# Patient Record
Sex: Male | Born: 1995 | Race: White | Hispanic: No | Marital: Single | Smoking: Never smoker
Health system: Southern US, Community
[De-identification: ages and names within clinical notes are randomized; demographics above are authoritative.]

## PROBLEM LIST (undated history)

## (undated) HISTORY — PX: KNEE SURGERY: SHX244

---
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
## 2019-01-28 ENCOUNTER — Telehealth: Payer: Self-pay | Admitting: Pediatrics

## 2019-01-28 NOTE — Telephone Encounter (Signed)
Patients mom advised and she accepted appointment. Appointment scheduled.

## 2019-01-28 NOTE — Telephone Encounter (Signed)
You can put him in my 1:40PM  slot on Monday, he should arrive by 1:25 PM. I have absolutely no other slots besides that one.

## 2019-01-28 NOTE — Telephone Encounter (Signed)
Mother Viliami Bracco [phone]  Calling for son/pt needing to see if he can possibly be worked in with [employer] as a new pt and discuss some to severe depression.  Santiago Glad thought Fabio Bering would be a good fit for pt and he said he would talk with her. However, Nov is too far out for the needs he is having and experiencing currently.  Please call Santiago Glad back asap to let her know if this can be done.  Thanks, [employer]

## 2019-02-01 ENCOUNTER — Other Ambulatory Visit: Payer: Self-pay

## 2019-02-01 ENCOUNTER — Encounter: Payer: Self-pay | Admitting: Physician Assistant

## 2019-02-01 ENCOUNTER — Telehealth: Payer: Self-pay | Admitting: Physician Assistant

## 2019-02-01 ENCOUNTER — Ambulatory Visit (INDEPENDENT_AMBULATORY_CARE_PROVIDER_SITE_OTHER): Payer: BC Managed Care – PPO | Admitting: Physician Assistant

## 2019-02-01 VITALS — BP 115/75 | HR 78 | Temp 97.5°F | Ht 70.0 in | Wt 189.0 lb

## 2019-02-01 DIAGNOSIS — F4323 Adjustment disorder with mixed anxiety and depressed mood: Secondary | ICD-10-CM | POA: Diagnosis not present

## 2019-02-01 DIAGNOSIS — Z23 Encounter for immunization: Secondary | ICD-10-CM | POA: Diagnosis not present

## 2019-02-01 NOTE — Patient Instructions (Signed)

## 2019-02-01 NOTE — Telephone Encounter (Signed)
Mom would like Bradley Morse to call her before her sons appt at 1:20 today.  She would like to go over something if she can before she sees him today.  Mom's # [phone]  Con Memos

## 2019-02-01 NOTE — Progress Notes (Signed)
Patient: Bradley Morse, Male    DOB: 06-07-1995, 23 y.o.   MRN: [id number] Visit Date: 02/01/2019  Today's Provider: Trey Sailors, PA-C   Chief Complaint  Patient presents with  . New Patient (Initial Visit)   Subjective:  I, Porsha McClurkin CMA, am acting as a Neurosurgeon for [employer], PA-C.    New Patient Appointment Bradley Morse is a 24 y.o. male who presents today for new patient appointment. He feels well. He reports exercising includes weight lifting and cardio. He reports he is sleeping poorly.  Lives in [place] with mom, dad, sisters, and two grandmothers. He is studying Doctor, hospital at [employer] and anticipates completing his degree in 2021-2022.   Smoking: none Drugs: marijuana Alcohol: 0 drinks/ week average but does drink some occasionally.   He is presenting today with the following issues. He reports trouble sleeping. Reports his parents are concerned but he is as well. Reports he is "in his head" frequently and worries excessively about things that he knows he shouldn't worry about. He reports this started in 2017. In 2017 he attended [employer] for university but ended up dropping out due to poor academic performance. He reports at that time he was down on himself and did not feel smart enough. He reports he has never been formally diagnosed with anxiety and depression. Cites difficult relationship with his twin sister whom he lives with, which is normal. He also cites difficult relationship with father.   He has lost weight over the past several months but he has been working out frequently and intending to lose weight.   Patient asks about other possible causes including thyroid issues.   He got his flu shot last week. Does not recall last tetanus vaccine.   Wt Readings from Last 3 Encounters:  02/01/19 189 lb (85.7 kg)       Office Visit from 02/01/2019 in [facility]  PHQ-9 Total Score  12       -----------------------------------------------------------------   Review of Systems  Constitutional: Negative.   HENT: Negative.   Eyes: Negative.   Respiratory: Negative.   Cardiovascular: Negative.   Gastrointestinal: Negative.   Endocrine: Negative.   Genitourinary: Negative.   Musculoskeletal: Negative.   Skin: Negative.   Allergic/Immunologic: Negative.   Neurological: Negative.   Hematological: Negative.   Psychiatric/Behavioral: Negative.     Social History He  reports that he has never smoked. He has never used smokeless tobacco. He reports current alcohol use. He reports that he does not use drugs. Social History   Socioeconomic History  . Marital status: Single    Spouse name: Not on file  . Number of children: Not on file  . Years of education: Not on file  . Highest education level: Not on file  Occupational History  . Not on file  Social Needs  . Financial resource strain: Not on file  . Food insecurity    Worry: Not on file    Inability: Not on file  . Transportation needs    Medical: Not on file    Non-medical: Not on file  Tobacco Use  . Smoking status: Never Smoker  . Smokeless tobacco: Never Used  Substance and Sexual Activity  . Alcohol use: Yes    Alcohol/week: 0.0 standard drinks  . Drug use: Never  . Sexual activity: Not on file  Lifestyle  . Physical activity    Days per week: Not on file  Minutes per session: Not on file  . Stress: Not on file  Relationships  . Social Musicianconnections    Talks on phone: Not on file    Gets together: Not on file    Attends religious service: Not on file    Active member of club or organization: Not on file    Attends meetings of clubs or organizations: Not on file    Relationship status: Not on file  Other Topics Concern  . Not on file  Social History Narrative  . Not on file    There are no active problems to display for this patient.   Past Surgical History:  Procedure Laterality Date  .  KNEE SURGERY      Family History  No family status information on file.   His family history is not on file.     Allergies  Allergen Reactions  . Oxycodone     Previous Medications   ACETAMINOPHEN (TYLENOL) 500 MG TABLET    Take 500 mg by mouth every 6 (six) hours as needed.   CEPHALEXIN (KEFLEX) 500 MG CAPSULE    Take 1 capsule (500 mg total) by mouth 2 (two) times daily.   HYDROCODONE-ACETAMINOPHEN (NORCO/VICODIN) 5-325 MG TABLET    Take 1 tablet by mouth every 6 (six) hours as needed for moderate pain.   IBUPROFEN (ADVIL,MOTRIN) 200 MG TABLET    Take 200 mg by mouth every 6 (six) hours as needed.    Patient Care Team: Maryella ShiversPollak, Adriana M, PA-C as PCP - General (Physician Assistant)      Objective:   Vitals: BP 115/75 (BP Location: Left Arm, Patient Position: Sitting, Cuff Size: Normal)   Pulse 78   Temp (!) 97.5 F (36.4 C) (Temporal)   Ht 5\' 10"  (1.778 m)   Wt 189 lb (85.7 kg)   BMI 27.12 kg/m    Physical Exam Constitutional:      Appearance: Normal appearance.  Cardiovascular:     Rate and Rhythm: Normal rate and regular rhythm.     Heart sounds: Normal heart sounds.  Pulmonary:     Effort: Pulmonary effort is normal.     Breath sounds: Normal breath sounds.  Skin:    General: Skin is warm and dry.  Neurological:     Mental Status: He is alert and oriented to person, place, and time. Mental status is at baseline.  Psychiatric:        Mood and Affect: Mood normal.        Behavior: Behavior normal.      Depression Screen PHQ 2/9 Scores 02/01/2019  PHQ - 2 Score 2  PHQ- 9 Score 12      Assessment & Plan:     Routine Health Maintenance and Physical Exam  Exercise Activities and Dietary recommendations Goals   None      There is no immunization history on file for this patient.  Health Maintenance  Topic Date Due  . HIV Screening  12/13/2011  . TETANUS/TDAP  12/13/2015  . INFLUENZA VACCINE  11/07/2018     Discussed health benefits of  physical activity, and encouraged him to engage in regular exercise appropriate for his age and condition.    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 Patient would like to pursue counseling and return if he feels the need for further treatment. Counseled patient I feel his issues are unlikely due to  his thyroid but I am happy to pursue labwork. He defers at this time but knows he can message or call the clinic if he would like to get labwork.  - Ambulatory referral to Chronic Care Management Services  2. Need for tetanus booster  Flu vaccine last week, will give this at next clinic visit.   The entirety of the information documented in the History of Present Illness, Review of Systems and Physical Exam were personally obtained by me. Portions of this information were initially documented by [employer] McClurkin, CMA and reviewed by me for thoroughness and accuracy.   F/u PRN    --------------------------------------------------------------------

## 2019-02-01 NOTE — Telephone Encounter (Signed)
Mom wrote a note to [place].

## 2019-02-08 ENCOUNTER — Telehealth: Payer: Self-pay | Admitting: Physician Assistant

## 2019-02-08 NOTE — Telephone Encounter (Signed)
Mom of pt called checking the name of the provider that will be the counselor for pt to see if he/she will be in insurance network.  No name was given.  Just an FYI to let Fabio Bering know pt has not heard from a counselor's office to schedule the appt.  Thanks, [employer]

## 2019-02-09 NOTE — Telephone Encounter (Signed)
Referral was put in 02/01/19, is there any information I can relay to patient about his appointment? KW

## 2019-02-15 ENCOUNTER — Ambulatory Visit: Payer: Self-pay | Admitting: *Deleted

## 2019-02-15 ENCOUNTER — Encounter: Payer: Self-pay | Admitting: *Deleted

## 2019-02-15 NOTE — Chronic Care Management (AMB) (Deleted)
    Care Management   Unsuccessful Call Note 02/15/2019 Name: Bradley Morse MRN: [id number] DOB: 15-Nov-1995  Patient  is a 23 year old male who sees Carles Collet, [place] for primary care. Carles Collet PA-C asked the CCM team to consult the patient for mental health counseling and resources.  Referral was placed 02/01/19. Patient's last office visit was 02/01/19.     This social worker was unable to reach patient via telephone today to consent for services. I have left HIPAA compliant voicemail asking patient to return my call. (unsuccessful outreach #1).   Plan: Will follow-up within 7 business days via telephone.      Elliot Gurney, [place] Worker  [facility] Care Management [phone]

## 2019-02-15 NOTE — Chronic Care Management (AMB) (Signed)
   Care Management   Unsuccessful Call Note 02/15/2019 Name: Bradley Morse MRN: [id number] DOB: April 30, 1995  Patient  is a 23 year old male who sees Carles Collet, [place]  for primary care. Carles Collet, PA-C asked the CCM team to consult the patient for Mental Health Counseling and Resources. Referral was placed 02/01/19. Patient's last office visit was 02/01/19.     This social worker was unable to reach patient via telephone today to introduce services and complete assessment. I have left HIPAA compliant voicemail asking patient to return my call. (unsuccessful outreach #1).   Plan: Will follow-up within 7 business days via telephone.      Elliot Gurney, [place] Worker  [facility] [phone]

## 2019-02-15 NOTE — Progress Notes (Signed)
This encounter was created in error - please disregard.

## 2019-02-22 ENCOUNTER — Ambulatory Visit: Payer: Self-pay | Admitting: *Deleted

## 2019-02-22 NOTE — Chronic Care Management (AMB) (Signed)
Care Management   Social Work Note  02/22/2019 Name: Bradley Morse MRN: [id number] DOB: 12-06-1995  Bradley Morse is a 23 y.o. year old male who sees Trinna Post, [place] for primary care. The CCM team was consulted for assistance with Mental Health Counseling and Resources.   Phone call to patient to schedule initial appointment for CM services. Appointment scheduled for 02/26/19 at 10am.  Outpatient Encounter Medications as of 02/22/2019  Medication Sig  . acetaminophen (TYLENOL) 500 MG tablet Take 500 mg by mouth every 6 (six) hours as needed.  Marland Kitchen ibuprofen (ADVIL,MOTRIN) 200 MG tablet Take 200 mg by mouth every 6 (six) hours as needed.   No facility-administered encounter medications on file as of 02/22/2019.     Goals Addressed   None     Follow Up Plan: Appointment scheduled for SW follow up with client by phone on: 02/26/19 at [facility], [place] Worker  [facility] [phone]

## 2019-02-26 ENCOUNTER — Encounter: Payer: Self-pay | Admitting: *Deleted

## 2019-02-26 ENCOUNTER — Ambulatory Visit: Payer: Self-pay | Admitting: *Deleted

## 2019-02-26 ENCOUNTER — Telehealth: Payer: Self-pay

## 2019-02-26 DIAGNOSIS — F4323 Adjustment disorder with mixed anxiety and depressed mood: Secondary | ICD-10-CM

## 2019-02-26 NOTE — Chronic Care Management (AMB) (Signed)
Care Management    Clinical Social Work General Note  02/26/2019 Name: Bradley Morse MRN: [id number] DOB: 17-Nov-1995  Bradley Morse is a 23 y.o. year old male who is a primary care patient of Bradley Morse, [place]. The CM was consulted to assist the patient with Mental Health Counseling and Resources.   Bradley Morse was given information about  Care Management services today including:  1. CM service includes personalized support from designated clinical staff supervised by his physician, including individualized plan of care and coordination with other care providers 2. 24/7 contact phone numbers for assistance for urgent and routine care needs.. 3. The patient may stop CM services at any time (effective at the end of the month) by phone call to the office staff.   Patient agreed to services and verbal consent obtained.   Review of patient status, including review of consultants reports, relevant laboratory and other test results, and collaboration with appropriate care team members and the patient's provider was performed as part of comprehensive patient evaluation and provision of chronic care management services.    SDOH (Social Determinants of Health) screening performed today. See Care Plan Entry related to challenges with: Stress  Outpatient Encounter Medications as of 02/26/2019  Medication Sig  . acetaminophen (TYLENOL) 500 MG tablet Take 500 mg by mouth every 6 (six) hours as needed.  Marland Kitchen ibuprofen (ADVIL,MOTRIN) 200 MG tablet Take 200 mg by mouth every 6 (six) hours as needed.   No facility-administered encounter medications on file as of 02/26/2019.     Goals Addressed            This Visit's Progress   . "I need to learn to control my anger" (pt-stated)       Current Barriers:  . Chronic Mental Health needs related to anxiety and depression . Mental Health Concerns  . Lacks knowledge of community resource: local mental health providers that specialize in anxiety .  Suicidal Ideation/Homicidal Ideation: No  Clinical Social Work Goal(s):  Marland Kitchen Over the next 90 days, patient will work with SW bi-weekly by telephone or in person to reduce or manage symptoms related to anxiety and depression . Over the next 90 days, patient will follow up with with a local therapist that specializes in the treatment of anxiety* as directed by SW  Interventions: . Patient interviewed and appropriate assessments performed: PHQ 2 and PHQ 9 . Discussed recent break up with girlfriend of 4 years . Discussed increased anxiety and concerns with his ability to manage his temper . Explored patient's network of supportive family and friends . Provided patient with supportive counseling and emotional support related to concerns discussed and recommended referral to a local mental health provider that specializes in the treatment of anxiety . Discussed plans with patient for ongoing care management follow up and provided patient with direct contact information for care management team   Patient Self Care Activities:  . Performs ADL's independently . Performs IADL's independently . Motivation for treatment . Strong family or social support  Patient Coping Strengths:  . Family . Friends . Able to Communicate Effectively  Patient Self Care Deficits:  . Lack of knowledge of local therapist that specialize in the treatment of anxiety and depression    Initial goal documentation         Follow Up Plan: SW will follow up with patient by phone over the next 2 weeks        Bradley Morse, [place] Social Worker  [place] [facility] [phone]

## 2019-02-26 NOTE — Patient Instructions (Signed)
Thank you allowing the Chronic Care Management Team to be a part of your care! It was a pleasure speaking with you today!  1. Please call this social worker with any questions or concerns regarding your mental health needs  CCM (Chronic Care Management) Team   Neldon Labella RN, BSN Nurse Care Coordinator  [phone]  Ruben Reason PharmD  Clinical Pharmacist  [phone]   Bessie, LCSW Clinical Social Worker [phone]  Goals Addressed            This Visit's Progress   . "I need to learn to control my anger" (pt-stated)       Current Barriers:  . Chronic Mental Health needs related to anxiety and depression . Mental Health Concerns  . Lacks knowledge of community resource: local mental health providers that specialize in anxiety . Suicidal Ideation/Homicidal Ideation: No  Clinical Social Work Goal(s):  Marland Kitchen Over the next 90 days, patient will work with SW bi-weekly by telephone or in person to reduce or manage symptoms related to anxiety and depression . Over the next 90 days, patient will follow up with with a local therapist that specializes in the treatment of anxiety* as directed by SW  Interventions: . Patient interviewed and appropriate assessments performed: PHQ 2 and PHQ 9 . Discussed recent break up with girlfriend of 4 years . Discussed increased anxiety and concerns with his ability to manage his temper . Explored patient's network of supportive family and friends . Provided patient with supportive counseling and emotional support related to concerns discussed and recommended referral to a local mental health provider that specializes in the treatment of anxiety . Discussed plans with patient for ongoing care management follow up and provided patient with direct contact information for care management team   Patient Self Care Activities:  . Performs ADL's independently . Performs IADL's independently . Motivation for treatment . Strong  family or social support  Patient Coping Strengths:  . Family . Friends . Able to Communicate Effectively  Patient Self Care Deficits:  . Lack of knowledge of local therapist that specialize in the treatment of anxiety and depression    Initial goal documentation         The patient verbalized understanding of instructions provided today and declined a print copy of patient instruction materials.   Telephone follow up appointment with care management team member scheduled for:03/01/19

## 2019-03-01 ENCOUNTER — Ambulatory Visit: Payer: Self-pay | Admitting: *Deleted

## 2019-03-01 DIAGNOSIS — F4323 Adjustment disorder with mixed anxiety and depressed mood: Secondary | ICD-10-CM

## 2019-03-01 NOTE — Chronic Care Management (AMB) (Signed)
   Care Management    Clinical Social Work Follow Up Note  03/01/2019 Name: Bradley Morse MRN: [id number] DOB: 06-11-1995  Bradley Morse is a 23 y.o. year old male who is a primary care patient of Trinna Post, [place]. The CCM team was consulted for assistance with Mental Health Counseling and Resources.   Review of patient status, including review of consultants reports, other relevant assessments, and collaboration with appropriate care team members and the patient's provider was performed as part of comprehensive patient evaluation and provision of chronic care management services.    Advanced Directives Status: <no information> See Care Plan for related entries.   Outpatient Encounter Medications as of 03/01/2019  Medication Sig  . acetaminophen (TYLENOL) 500 MG tablet Take 500 mg by mouth every 6 (six) hours as needed.  Marland Kitchen ibuprofen (ADVIL,MOTRIN) 200 MG tablet Take 200 mg by mouth every 6 (six) hours as needed.   No facility-administered encounter medications on file as of 03/01/2019.      Goals Addressed            This Visit's Progress   . "I need to learn to control my anger" (pt-stated)       Current Barriers:  . Chronic Mental Health needs related to anxiety and depression . Mental Health Concerns  . Lacks knowledge of community resource: local mental health providers that specialize in anxiety . Suicidal Ideation/Homicidal Ideation: No  Clinical Social Work Goal(s):  Marland Kitchen Over the next 90 days, patient will work with SW bi-weekly by telephone or in person to reduce or manage symptoms related to anxiety and depression . Over the next 90 days, patient will follow up with with a local therapist that specializes in the treatment of anxiety* as directed by SW  Interventions: . Patient interviewed and appropriate assessments performed . Continue to recommend that patient follow up with a local therapist for ongoing mental health treatment . Provided referrals to the  following: [facility] [phone] [facility] and [facility] [phone] . Encouraged patient to call to set up the initial appointment . Discussed plans with patient for ongoing care management follow up and provided patient with direct contact information for care management team   Patient Self Care Activities:  . Performs ADL's independently . Performs IADL's independently . Motivation for treatment . Strong family or social support  Patient Coping Strengths:  . Family . Friends . Able to Communicate Effectively  Patient Self Care Deficits:  . Lack of knowledge of local therapist that specialize in the treatment of anxiety and depression    Please see past updates related to this goal by clicking on the "Past Updates" button in the selected goal          Follow Up Plan: SW will follow up with patient by phone over the next 2 weeks to follow up on mental health referrals provided    Elliot Gurney, [place] Worker  [facility] Care [facility] [phone]

## 2019-03-01 NOTE — Patient Instructions (Signed)
Thank you allowing the Chronic Care Management Team to be a part of your care! It was a pleasure speaking with you today!  1. Please call the metal health agencies provided to schedule your initial appointment. 2. Please call this social worker if there are any questions or concerns regarding your mental health needs.   CCM (Chronic Care Management) Team   Neldon Labella  RN, BSN Nurse Care Coordinator  [phone]  Ruben Reason PharmD  Clinical Pharmacist  [phone]   Elliot Gurney, LCSW Clinical Social Worker [phone]  Goals Addressed            This Visit's Progress   . "I need to learn to control my anger" (pt-stated)       Current Barriers:  . Chronic Mental Health needs related to anxiety and depression . Mental Health Concerns  . Lacks knowledge of community resource: local mental health providers that specialize in anxiety . Suicidal Ideation/Homicidal Ideation: No  Clinical Social Work Goal(s):  Marland Kitchen Over the next 90 days, patient will work with SW bi-weekly by telephone or in person to reduce or manage symptoms related to anxiety and depression . Over the next 90 days, patient will follow up with with a local therapist that specializes in the treatment of anxiety* as directed by SW  Interventions: . Patient interviewed and appropriate assessments performed . Continue to recommend that patient follow up with a local therapist for ongoing mental health treatment . Provided referrals to the following: [facility] [phone] [facility] and [facility] [place] [phone] . Encouraged patient to call to set up the initial appointment . Discussed plans with patient for ongoing care management follow up and provided patient with direct contact information for care management team   Patient Self Care Activities:  . Performs ADL's independently . Performs IADL's independently . Motivation for  treatment . Strong family or social support  Patient Coping Strengths:  . Family . Friends . Able to Communicate Effectively  Patient Self Care Deficits:  . Lack of knowledge of local therapist that specialize in the treatment of anxiety and depression    Please see past updates related to this goal by clicking on the "Past Updates" button in the selected goal          The patient verbalized understanding of instructions provided today and declined a print copy of patient instruction materials.   The care management team will reach out to the patient again over the next 14 days. to follow up on mental health referrals provided

## 2019-03-08 ENCOUNTER — Telehealth: Payer: Self-pay

## 2019-03-10 ENCOUNTER — Telehealth: Payer: Self-pay | Admitting: *Deleted

## 2019-03-10 ENCOUNTER — Ambulatory Visit: Payer: Self-pay | Admitting: *Deleted

## 2019-03-10 NOTE — Chronic Care Management (AMB) (Signed)
   Chronic Care Management   Unsuccessful Call Note 03/10/2019 Name: Bradley Morse MRN: [id number] DOB: 1996/01/30  Patient  is a 23 year old male who sees Carles Collet, [place] for primary care. Carles Collet, PA-C asked the CCM team to consult the patient for Mental Health Counseling and Resources.     This social worker was unable to reach patient via telephone today for follow up call. I have left HIPAA compliant voicemail asking patient to return my call. (unsuccessful outreach #1).   Plan: Will follow-up within 7 business days via telephone.      Elliot Gurney, [place] Worker  [facility] Care [facility] [phone]

## 2019-03-17 ENCOUNTER — Ambulatory Visit: Payer: Self-pay | Admitting: *Deleted

## 2019-03-17 ENCOUNTER — Telehealth: Payer: Self-pay | Admitting: *Deleted

## 2019-03-17 NOTE — Chronic Care Management (AMB) (Signed)
    Care Management   Unsuccessful Call Note 03/17/2019 Name: Bradley Morse MRN: [id number] DOB: 1995/11/25  Patient  is a 23 year old male who sees Carles Collet, [place]  for primary care. Carles Collet PA-C asked the CCM team to consult the patient for mental health counseling and resources.    This social worker was unable to reach patient via telephone today for follow up call on resources provided. I have left HIPAA compliant voicemail asking patient to return my call. (unsuccessful outreach #2).   Plan: Will follow-up within 7 business days via telephone.      Elliot Gurney, [place] Worker  [facility] [phone]

## 2019-04-05 ENCOUNTER — Ambulatory Visit: Payer: Self-pay | Admitting: *Deleted

## 2019-04-05 DIAGNOSIS — F4323 Adjustment disorder with mixed anxiety and depressed mood: Secondary | ICD-10-CM

## 2019-04-05 NOTE — Patient Instructions (Signed)
Thank you allowing the Chronic Care Management Team to be a part of your care! It was a pleasure speaking with you today!  1. Please contact your insurance company for list of in-network mental health therapist 2. Please cal this Education officer, museum with any questions or concerns regarding your mental health needs.  CCM (Chronic Care Management) Team   Neldon Labella  RN, BSN Nurse Care Coordinator  [phone]  Ruben Reason PharmD  Clinical Pharmacist  [phone]   Elliot Gurney, LCSW Clinical Social Worker [phone]  Goals Addressed            This Visit's Progress   . "I need to learn to control my anger" (pt-stated)       Current Barriers:  . Chronic Mental Health needs related to anxiety and depression . Mental Health Concerns  . Lacks knowledge of community resource: local mental health providers that specialize in anxiety . Suicidal Ideation/Homicidal Ideation: No  Clinical Social Work Goal(s):  Marland Kitchen Over the next 90 days, patient will work with SW bi-weekly by telephone or in person to reduce or manage symptoms related to anxiety and depression . Over the next 90 days, patient will follow up with with a local therapist that specializes in the treatment of anxiety* as directed by SW  Interventions: . Followed up on local therapist provided for ongoing mental health treatment . Provided referrals to the following: [facility] [phone] [facility] and [facility] [phone] . Patient confirmed that he is still looking for a therapist, above names did not work out for him for various reasons . Patient confirmed that his mood is beginning to improve, however the plan is to continue to look for a therapist for ongoing support . Encouraged patient to contact his insurance company for additional mental health resources . Discussed plans with patient to contact this social worker with any problems in identifying a  mental health therapist for ongoing support.   Patient Self Care Activities:  . Performs ADL's independently . Performs IADL's independently . Motivation for treatment . Strong family or social support  Patient Coping Strengths:  . Family . Friends . Able to Communicate Effectively  Patient Self Care Deficits:  . Lack of knowledge of local therapist that specialize in the treatment of anxiety and depression    Please see past updates related to this goal by clicking on the "Past Updates" button in the selected goal          The patient verbalized understanding of instructions provided today and declined a print copy of patient instruction materials.   No further follow up required: patient will call this social worker with any questions or concerns regarding your mental health needs

## 2019-04-05 NOTE — Chronic Care Management (AMB) (Signed)
   Care Management    Clinical Social Work Follow Up Note  04/05/2019 Name: Bradley Morse MRN: [id number] DOB: 05/05/1995  Bradley Morse is a 23 y.o. year old male who is a primary care patient of Trinna Post, [place]. The CCM team was consulted for assistance with Mental Health Counseling and Resources.   Review of patient status, including review of consultants reports, other relevant assessments, and collaboration with appropriate care team members and the patient's provider was performed as part of comprehensive patient evaluation and provision of chronic care management services.     Advanced Directives Status: <no information> See Care Plan for related entries.   Outpatient Encounter Medications as of 04/05/2019  Medication Sig  . acetaminophen (TYLENOL) 500 MG tablet Take 500 mg by mouth every 6 (six) hours as needed.  Marland Kitchen ibuprofen (ADVIL,MOTRIN) 200 MG tablet Take 200 mg by mouth every 6 (six) hours as needed.   No facility-administered encounter medications on file as of 04/05/2019.     Goals Addressed            This Visit's Progress   . "I need to learn to control my anger" (pt-stated)       Current Barriers:  . Chronic Mental Health needs related to anxiety and depression . Mental Health Concerns  . Lacks knowledge of community resource: local mental health providers that specialize in anxiety . Suicidal Ideation/Homicidal Ideation: No  Clinical Social Work Goal(s):  Marland Kitchen Over the next 90 days, patient will work with SW bi-weekly by telephone or in person to reduce or manage symptoms related to anxiety and depression . Over the next 90 days, patient will follow up with with a local therapist that specializes in the treatment of anxiety* as directed by SW  Interventions: . Followed up on local therapist provided for ongoing mental health treatment . Provided referrals to the following: [facility] [phone] [facility] and [facility] [phone] . Patient confirmed that he is still looking for a therapist, above names did not work out for him for various reasons . Patient confirmed that his mood is beginning to improve, however the plan is to continue to look for a therapist for ongoing support . Encouraged patient to contact his insurance company for additional mental health resources . Discussed plans with patient to contact this social worker with any problems in identifying a mental health therapist for ongoing support.   Patient Self Care Activities:  . Performs ADL's independently . Performs IADL's independently . Motivation for treatment . Strong family or social support  Patient Coping Strengths:  . Family . Friends . Able to Communicate Effectively  Patient Self Care Deficits:  . Lack of knowledge of local therapist that specialize in the treatment of anxiety and depression    Please see past updates related to this goal by clicking on the "Past Updates" button in the selected goal          Follow Up Plan: Client will contact this social worker with additional questions or assistance needs with locating a therapist    Elliot Gurney, [place] Worker  [facility] [phone]
